# Patient Record
Sex: Male | Born: 1995 | Race: White | Hispanic: No | Marital: Single | Smoking: Never smoker
Health system: Southern US, Community
[De-identification: ages and names within clinical notes are randomized; demographics above are authoritative.]

---
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
## 2016-02-02 ENCOUNTER — Ambulatory Visit (INDEPENDENT_AMBULATORY_CARE_PROVIDER_SITE_OTHER): Payer: BLUE CROSS/BLUE SHIELD | Admitting: Physician Assistant

## 2016-02-02 VITALS — BP 104/70 | HR 90 | Temp 97.7°F | Resp 18 | Ht 68.5 in | Wt 147.6 lb

## 2016-02-02 DIAGNOSIS — J3089 Other allergic rhinitis: Secondary | ICD-10-CM | POA: Diagnosis not present

## 2016-02-02 DIAGNOSIS — R05 Cough: Secondary | ICD-10-CM

## 2016-02-02 DIAGNOSIS — R059 Cough, unspecified: Secondary | ICD-10-CM

## 2016-02-02 LAB — POCT CBC
Granulocyte percent: 73.6 %G (ref 37–80)
HEMATOCRIT: 44 % (ref 43.5–53.7)
Hemoglobin: 15.6 g/dL (ref 14.1–18.1)
LYMPH, POC: 1.7 (ref 0.6–3.4)
MCH: 29.7 pg (ref 27–31.2)
MCHC: 35.5 g/dL — AB (ref 31.8–35.4)
MCV: 83.7 fL (ref 80–97)
MID (CBC): 0.6 (ref 0–0.9)
MPV: 6.9 fL (ref 0–99.8)
PLATELET COUNT, POC: 156 10*3/uL (ref 142–424)
POC Granulocyte: 6.3 (ref 2–6.9)
POC LYMPH %: 19.5 % (ref 10–50)
POC MID %: 6.9 % (ref 0–12)
RBC: 5.25 M/uL (ref 4.69–6.13)
RDW, POC: 12.4 %
WBC: 8.5 10*3/uL (ref 4.6–10.2)

## 2016-02-02 MED ORDER — PREDNISONE 20 MG PO TABS: 40.0000 mg | ORAL_TABLET | Freq: Every day | ORAL | Status: AC

## 2016-02-02 MED ORDER — PREDNISONE 20 MG PO TABS: 20.0000 mg | ORAL_TABLET | Freq: Every day | ORAL | Status: DC

## 2016-02-02 MED ORDER — LEVOCETIRIZINE DIHYDROCHLORIDE 5 MG PO TABS: 5.0000 mg | ORAL_TABLET | Freq: Every evening | ORAL | Status: AC

## 2016-02-02 NOTE — Progress Notes (Signed)
02/02/2016 4:00 PM   DOB: 06-Jun-1996 / MRN: [id number]  SUBJECTIVE:  Cesar Lawrence is a 20 y.o. male presenting for cough and nasal congestion that has been present for roughly 1 month now.  He denies SOB, DOE, fever, chills, nausea.  He has a distant history of asthma.  He does not feel he is worse or better.  He mother would like for me to prescribe antibiotics.   He has No Known Allergies.   He  has a past medical history of Asthma.    He  reports that he has never smoked. He has never used smokeless tobacco. He reports that he does not drink alcohol or use illicit drugs. He  reports that he does not engage in sexual activity. The patient  has no past surgical history on file.  His family history includes Anxiety disorder in his father and mother; Depression in his father and mother.  Review of Systems  Constitutional: Negative for fever and chills.  Respiratory: Negative for hemoptysis, sputum production, shortness of breath and wheezing.   Cardiovascular: Negative for chest pain.  Skin: Negative for rash.  Neurological: Positive for headaches. Negative for dizziness.  Psychiatric/Behavioral: Negative for depression.    Problem list and medications reviewed and updated by myself where necessary, and exist elsewhere in the encounter.   OBJECTIVE:  BP 104/70 mmHg  Pulse 90  Temp(Src) 97.7 F (36.5 C) (Oral)  Resp 18  Ht 5' 8.5" (1.74 m)  Wt 147 lb 9.6 oz (66.951 kg)  BMI 22.11 kg/m2  SpO2 97%  Physical Exam  Constitutional: He is oriented to person, place, and time. He appears well-developed. He does not appear ill.  HENT:  Right Ear: Tympanic membrane normal.  Left Ear: Tympanic membrane normal.  Nose: Mucosal edema present.  Mouth/Throat: Uvula is midline, oropharynx is clear and moist and mucous membranes are normal.  Eyes: Conjunctivae and EOM are normal. Pupils are equal, round, and reactive to light.  Cardiovascular: Normal rate, regular rhythm, normal heart  sounds and intact distal pulses.   Pulmonary/Chest: Effort normal and breath sounds normal.  Abdominal: He exhibits no distension.  Musculoskeletal: Normal range of motion.  Neurological: He is alert and oriented to person, place, and time. No cranial nerve deficit. Coordination normal.  Skin: Skin is warm and dry. He is not diaphoretic.  Psychiatric: He has a normal mood and affect.  Nursing note and vitals reviewed.   Results for orders placed or performed in visit on 02/02/16 (from the past 72 hour(s))  POCT CBC     Status: Abnormal   Collection Time: 02/02/16  3:46 PM  Result Value Ref Range   WBC 8.5 4.6 - 10.2 K/uL   Lymph, poc 1.7 0.6 - 3.4   POC LYMPH PERCENT 19.5 10 - 50 %L   MID (cbc) 0.6 0 - 0.9   POC MID % 6.9 0 - 12 %M   POC Granulocyte 6.3 2 - 6.9   Granulocyte percent 73.6 37 - 80 %G   RBC 5.25 4.69 - 6.13 M/uL   Hemoglobin 15.6 14.1 - 18.1 g/dL   HCT, POC 95.644.0 21.343.5 - 53.7 %   MCV 83.7 80 - 97 fL   MCH, POC 29.7 27 - 31.2 pg   MCHC 35.5 (A) 31.8 - 35.4 g/dL   RDW, POC 08.612.4 %   Platelet Count, POC 156 142 - 424 K/uL   MPV 6.9 0 - 99.8 fL    No results found.  ASSESSMENT  AND PLAN  Cesar Lawrence was seen today for cough, nasal congestion and fatigue.  Diagnoses and all orders for this visit:  Cough -     POCT CBC  Environmental and seasonal allergies -     levocetirizine (XYZAL) 5 MG tablet; Take 1 tablet (5 mg total) by mouth every evening. Continue this after finishing prednisone. -     predniSONE (DELTASONE) 20 MG tablet; Take 2 tablets (40 mg total) by mouth daily with breakfast.  Other orders -     Discontinue: predniSONE (DELTASONE) 20 MG tablet; Take 1 tablet (20 mg total) by mouth daily with breakfast.    The patient was advised to call or return to clinic if he does not see an improvement in symptoms or to seek the care of the closest emergency department if he worsens with the above plan.   Deliah Boston, MHS, PA-C [facility] 02/02/2016 4:00 PM

## 2019-12-15 ENCOUNTER — Ambulatory Visit: Payer: Self-pay | Attending: Family

## 2019-12-15 DIAGNOSIS — Z23 Encounter for immunization: Secondary | ICD-10-CM

## 2019-12-15 NOTE — Progress Notes (Signed)
   Covid-19 Vaccination Clinic  Name:  Cesar Lawrence    MRN: [id number] DOB: 04-Apr-1996  12/15/2019  Mr. Cesar Lawrence was observed post Covid-19 immunization for 15 minutes without incident. He was provided with Vaccine Information Sheet and instruction to access the V-Safe system.   Mr. Cesar Lawrence was instructed to call 911 with any severe reactions post vaccine: Marland Kitchen Difficulty breathing  . Swelling of face and throat  . A fast heartbeat  . A bad rash all over body  . Dizziness and weakness   Immunizations Administered    Name Date Dose VIS Date Route   Moderna COVID-19 Vaccine 12/15/2019  1:21 PM 0.5 mL 08/16/2019 Intramuscular   Manufacturer: Moderna   Lot: [id number]   NDC: 20919-802-21

## 2020-01-17 ENCOUNTER — Ambulatory Visit: Payer: Self-pay | Attending: Internal Medicine
